# Patient Record
Sex: Male | Born: 2000 | Race: Black or African American | Hispanic: No | Marital: Single
Health system: Southern US, Community
[De-identification: ages and names within clinical notes are randomized; demographics above are authoritative.]

---
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
## 2022-02-11 ENCOUNTER — Other Ambulatory Visit: Payer: Self-pay

## 2022-02-11 ENCOUNTER — Emergency Department (HOSPITAL_COMMUNITY)
Admission: EM | Admit: 2022-02-11 | Discharge: 2022-02-11 | Disposition: A | Discharge status: Home / Self Care | Payer: Medicaid Other | Attending: Emergency Medicine | Admitting: Emergency Medicine

## 2022-02-11 ENCOUNTER — Encounter (HOSPITAL_COMMUNITY): Payer: Self-pay

## 2022-02-11 DIAGNOSIS — T63441A Toxic effect of venom of bees, accidental (unintentional), initial encounter: Secondary | ICD-10-CM | POA: Insufficient documentation

## 2022-02-11 DIAGNOSIS — R030 Elevated blood-pressure reading, without diagnosis of hypertension: Secondary | ICD-10-CM | POA: Diagnosis not present

## 2022-02-11 MED ORDER — CEPHALEXIN 500 MG PO CAPS: 500.0000 mg | ORAL_CAPSULE | Freq: Three times a day (TID) | ORAL | 0 refills | Status: AC

## 2022-02-11 MED ORDER — NAPROXEN 500 MG PO TABS: 500.0000 mg | ORAL_TABLET | Freq: Two times a day (BID) | ORAL | 0 refills | Status: AC | PRN

## 2022-02-11 NOTE — ED Triage Notes (Signed)
Pt was stung by bee 2 days ago on LUE and area is now swollen and red. No SOB or trouble breathing. Pt has not taken OTC meds

## 2022-02-11 NOTE — Discharge Instructions (Signed)
You were seen in the emergency department today for bee stings.  We recommend applying cool compresses.  Please take naproxen as needed for pain.  Naproxen is an NSAID, this is a nonsteroidal anti-inflammatory medicine to help with pain and swelling, take this every 12 hours as needed for pain/swelling, be sure to take this with food as it can cause stomach upset and or stomach bleeding.  Do not take other NSAID such as Motrin, ibuprofen, Advil, Aleve, Goody powder, Mobic, Aloxi cam etc. as these are similar medicines.   We also sent you with Keflex which is an antibiotic to cover for infection.  We have prescribed you new medication(s) today. Discuss the medications prescribed today with your pharmacist as they can have adverse effects and interactions with your other medicines including over the counter and prescribed medications. Seek medical evaluation if you start to experience new or abnormal symptoms after taking one of these medicines, seek care immediately if you start to experience difficulty breathing, feeling of your throat closing, facial swelling, or rash as these could be indications of a more serious allergic reaction  Follow-up with primary care in 1 week for reevaluation of the area.  Return to the ER for new or worsening symptoms including but not limited to spreading redness, increased pain, fever, numbness/weakness in your hand, trouble moving the arm, or any other concerns.

## 2022-02-11 NOTE — ED Provider Notes (Signed)
[facility] EMERGENCY DEPARTMENT Provider Note   CSN: [id number] Arrival date & time: 02/11/22  3716     History  Chief Complaint  Patient presents with   Insect Bite    Aaron Michael is a 21 y.o. male without significant past medical history who presents to the emergency department with complaints of bee stings to his left upper extremity that occurred 2 days prior.  Patient states he was stung in 2 places, since that he has had some worsening redness and swelling to the areas especially over the past 24 hours.  They are not particularly painful.  No alleviating or aggravating factors.  No intervention prior to arrival.  His friend became concerned prompting ED visit.  He denies fever, nausea, vomiting, shortness of breath, chest pain, or other areas of rashes/inflammation.  HPI     Home Medications Prior to Admission medications   Not on File      Allergies    Patient has no allergy information on record.    Review of Systems   Review of Systems  Constitutional:  Negative for chills and fever.  Respiratory:  Negative for cough and shortness of breath.   Cardiovascular:  Negative for chest pain.  Gastrointestinal:  Negative for abdominal pain, nausea and vomiting.  Skin:  Positive for color change and wound.  Neurological:  Negative for syncope.  All other systems reviewed and are negative.   Physical Exam Updated Vital Signs BP (!) 143/74 (BP Location: Right Arm)   Pulse 78   Temp 98.1 F (36.7 C) (Oral)   Resp 17   SpO2 100%  Physical Exam Vitals and nursing note reviewed.  Constitutional:      General: He is not in acute distress.    Appearance: Normal appearance. He is well-developed. He is not ill-appearing or toxic-appearing.  HENT:     Head: Normocephalic and atraumatic.     Mouth/Throat:     Comments: No angioedema.  Tolerating own secretions without difficulty. Eyes:     General:        Right eye: No discharge.        Left  eye: No discharge.     Conjunctiva/sclera: Conjunctivae normal.  Neck:     Comments: No midline tenderness.  Cardiovascular:     Rate and Rhythm: Normal rate and regular rhythm.     Pulses:          Radial pulses are 2+ on the right side and 2+ on the left side.  Pulmonary:     Effort: No respiratory distress.     Breath sounds: Normal breath sounds. No wheezing or rales.  Abdominal:     General: There is no distension.     Palpations: Abdomen is soft.     Tenderness: There is no abdominal tenderness.  Musculoskeletal:     Cervical back: Normal range of motion and neck supple.     Comments: Upper extremities: No obvious deformity, appreciable swelling, edema, erythema, ecchymosis, warmth, or open wounds. Patient has intact AROM throughout.  No tenderness to palpation.  Compartments are soft.  Skin:    General: Skin is warm and dry.     Capillary Refill: Capillary refill takes less than 2 seconds.          Comments: 7-8 cm diameter areas of erythema with mild swelling and tenderness to palpation.  Mildly indurated.  No fluctuance.  Not significantly tender to palpation.  Neurological:     Mental Status: He  is alert.     Comments: Alert. Clear speech. Sensation grossly intact to bilateral upper extremities. 5/5 symmetric grip strength. Ambulatory.   Psychiatric:        Mood and Affect: Mood normal.        Behavior: Behavior normal.     ED Results / Procedures / Treatments   Labs (all labs ordered are listed, but only abnormal results are displayed) Labs Reviewed - No data to display  EKG None  Radiology No results found.  Procedures Procedures    Medications Ordered in ED Medications - No data to display  ED Course/ Medical Decision Making/ A&P                           Medical Decision Making  Patient presents to the emergency department with complaints of redness and swelling to bee stings sustained 2 days prior.  He is nontoxic, resting comfortably, vitals  with some elevated blood pressure, doubt hypertensive emergency.  Chart/nursing note reviewed.  No angioedema, airway is patent, clinically does not seem consistent with anaphylaxis.  Does have 2 areas of erythema with swelling and induration present to the left upper extremity.  Placed bedside ultrasound, no foreign body or abscess identified.  Neurovascular intact distally.  Compartments are soft.  Suspect large localized reaction, however given progressive worsening will cover for possible cellulitis.  NSAIDs and Keflex prescribed.  Discussed cool compresses.  PCP follow-up.  I discussed  treatment plan, need for follow-up, and return precautions with the patient. Provided opportunity for questions, patient confirmed understanding and is in agreement with plan.   Final Clinical Impression(s) / ED Diagnoses Final diagnoses:  Bee sting, accidental or unintentional, initial encounter    Rx / DC Orders ED Discharge Orders          Ordered    cephALEXin (KEFLEX) 500 MG capsule  3 times daily        02/11/22 0430    naproxen (NAPROSYN) 500 MG tablet  2 times daily PRN        02/11/22 0430              Juanitta Earnhardt, Pleas Koch, PA-C 02/11/22 0431    Tilden Fossa, MD 02/11/22 [phone]

## 2024-07-04 ENCOUNTER — Other Ambulatory Visit: Payer: Self-pay

## 2024-07-04 ENCOUNTER — Emergency Department (HOSPITAL_COMMUNITY)
Admission: EM | Admit: 2024-07-04 | Discharge: 2024-07-04 | Disposition: A | Discharge status: Home / Self Care | Attending: Emergency Medicine | Admitting: Emergency Medicine

## 2024-07-04 DIAGNOSIS — Z79899 Other long term (current) drug therapy: Secondary | ICD-10-CM | POA: Insufficient documentation

## 2024-07-04 DIAGNOSIS — R112 Nausea with vomiting, unspecified: Secondary | ICD-10-CM | POA: Diagnosis present

## 2024-07-04 DIAGNOSIS — K529 Noninfective gastroenteritis and colitis, unspecified: Secondary | ICD-10-CM | POA: Diagnosis not present

## 2024-07-04 LAB — BASIC METABOLIC PANEL WITH GFR
Anion gap: 13 (ref 5–15)
BUN: 14 mg/dL (ref 6–20)
CO2: 21 mmol/L — ABNORMAL LOW (ref 22–32)
Calcium: 9.6 mg/dL (ref 8.9–10.3)
Chloride: 106 mmol/L (ref 98–111)
Creatinine, Ser: 1.08 mg/dL (ref 0.61–1.24)
GFR, Estimated: >60 mL/min
Glucose, Bld: 69 mg/dL — ABNORMAL LOW (ref 70–99)
Potassium: 4.6 mmol/L (ref 3.5–5.1)
Sodium: 140 mmol/L (ref 135–145)

## 2024-07-04 LAB — CBC WITH DIFFERENTIAL/PLATELET
Abs Immature Granulocytes: 0.01 K/uL (ref 0.00–0.07)
Basophils Absolute: 0 K/uL (ref 0.0–0.1)
Basophils Relative: 0 %
Eosinophils Absolute: 0.1 K/uL (ref 0.0–0.5)
Eosinophils Relative: 1 %
HCT: 45.2 % (ref 39.0–52.0)
Hemoglobin: 15.1 g/dL (ref 13.0–17.0)
Immature Granulocytes: 0 %
Lymphocytes Relative: 41 %
Lymphs Abs: 3.5 K/uL (ref 0.7–4.0)
MCH: 30.3 pg (ref 26.0–34.0)
MCHC: 33.4 g/dL (ref 30.0–36.0)
MCV: 90.6 fL (ref 80.0–100.0)
Monocytes Absolute: 0.7 K/uL (ref 0.1–1.0)
Monocytes Relative: 8 %
Neutro Abs: 4.2 K/uL (ref 1.7–7.7)
Neutrophils Relative %: 50 %
Platelets: 169 K/uL (ref 150–400)
RBC: 4.99 MIL/uL (ref 4.22–5.81)
RDW: 13.3 % (ref 11.5–15.5)
WBC: 8.5 K/uL (ref 4.0–10.5)
nRBC: 0 % (ref 0.0–0.2)

## 2024-07-04 LAB — CBG MONITORING, ED: Glucose-Capillary: 84 mg/dL (ref 70–99)

## 2024-07-04 MED ORDER — ONDANSETRON 4 MG PO TBDP
4.0000 mg | ORAL_TABLET | Freq: Once | ORAL | Status: AC
  Administered 2024-07-04: 4 mg via ORAL
  Filled 2024-07-04: qty 1

## 2024-07-04 MED ORDER — ACETAMINOPHEN 325 MG PO TABS
650.0000 mg | ORAL_TABLET | Freq: Once | ORAL | Status: AC
  Administered 2024-07-04: 650 mg via ORAL
  Filled 2024-07-04: qty 2

## 2024-07-04 MED ORDER — ONDANSETRON HCL 4 MG PO TABS: 4.0000 mg | ORAL_TABLET | Freq: Four times a day (QID) | ORAL | 0 refills | Status: AC

## 2024-07-04 NOTE — ED Provider Notes (Signed)
 " [place] EMERGENCY DEPARTMENT AT [facility] Provider Note   CSN: [id number] Arrival date & time: 07/04/24  9478     Patient presents with: Emesis   Aaron Michael is a 23 y.o. male.   Patient is a 23 year old male with no significant past medical history presenting to the emergency department with headache, nausea and diarrhea.  Patient states that he was at work last night when he started to develop a headache and nausea and had 1-2 episodes of small amount of diarrhea.  He denies any associated abdominal pain, fever, cough, congestion or sore throat.  Denies any known sick contacts.  The history is provided by the patient.  Emesis      Prior to Admission medications  Medication Sig Start Date End Date Taking? Authorizing Provider  ondansetron (ZOFRAN) 4 MG tablet Take 1 tablet (4 mg total) by mouth every 6 (six) hours. 07/04/24  Yes Ellouise, Tziporah Knoke K, DO  cephALEXin  (KEFLEX ) 500 MG capsule Take 1 capsule (500 mg total) by mouth 3 (three) times daily. 02/11/22   Petrucelli, Samantha R, PA-C  naproxen  (NAPROSYN ) 500 MG tablet Take 1 tablet (500 mg total) by mouth 2 (two) times daily as needed for moderate pain. 02/11/22   Petrucelli, Samantha R, PA-C    Allergies: Patient has no known allergies.    Review of Systems  Gastrointestinal:  Positive for vomiting.    Updated Vital Signs BP 126/68   Pulse 63   Temp 98.2 F (36.8 C)   Resp 16   SpO2 100%   Physical Exam Vitals and nursing note reviewed.  Constitutional:      General: He is not in acute distress.    Appearance: Normal appearance.  HENT:     Head: Normocephalic and atraumatic.     Nose: Nose normal.     Mouth/Throat:     Mouth: Mucous membranes are moist.     Pharynx: Oropharynx is clear.  Eyes:     Extraocular Movements: Extraocular movements intact.     Conjunctiva/sclera: Conjunctivae normal.  Cardiovascular:     Rate and Rhythm: Normal rate and regular rhythm.     Heart sounds:  Normal heart sounds.  Pulmonary:     Effort: Pulmonary effort is normal.     Breath sounds: Normal breath sounds.  Abdominal:     General: Abdomen is flat.     Palpations: Abdomen is soft.     Tenderness: There is no abdominal tenderness.  Musculoskeletal:        General: Normal range of motion.     Cervical back: Normal range of motion.  Skin:    General: Skin is warm and dry.  Neurological:     General: No focal deficit present.     Mental Status: He is alert and oriented to person, place, and time.  Psychiatric:        Mood and Affect: Mood normal.        Behavior: Behavior normal.     (all labs ordered are listed, but only abnormal results are displayed) Labs Reviewed  BASIC METABOLIC PANEL WITH GFR - Abnormal; Notable for the following components:      Result Value   CO2 21 (*)    Glucose, Bld 69 (*)    All other components within normal limits  CBC WITH DIFFERENTIAL/PLATELET  CBG MONITORING, ED    EKG: None  Radiology: No results found.   Procedures   Medications Ordered in the ED  acetaminophen (TYLENOL)  tablet 650 mg (650 mg Oral Given 07/04/24 0531)  ondansetron (ZOFRAN-ODT) disintegrating tablet 4 mg (4 mg Oral Given 07/04/24 0531)                                    Medical Decision Making This patient presents to the ED with chief complaint(s) of headache, nausea, diarrhea with no pertinent past medical history which further complicates the presenting complaint. The complaint involves an extensive differential diagnosis and also carries with it a high risk of complications and morbidity.    The differential diagnosis includes gastroenteritis, gastritis, GERD, pancreatitis, hepatitis, dehydration, electrolyte abnormality, viral syndrome, no abdominal pain making intra-abdominal infection less likely  Additional history obtained: Additional history obtained from N/A Records reviewed N/A  ED Course and Reassessment: On patient's arrival he is  hemodynamically stable in no acute distress.  Had labs initiated in triage.  He had mild hypoglycemia that was normal on repeat.  He was given Tylenol and Zofran in triage and states that his symptoms have resolved.  Spoke to the patient about considering adding on LFTs and lipase but as he is asymptomatic at this time and never had abdominal pain suspect this is likely viral syndrome and have low suspicion for pancreas or hepatic abnormalities.  Patient is comfortable with discharge home and was recommended outpatient follow-up.  He was given strict return precautions.  Independent labs interpretation:  The following labs were independently interpreted: Mild hypoglycemia, improved on repeat  Independent visualization of imaging: - N/A  Consultation: - Consulted or discussed management/test interpretation w/ external professional: N/a  Consideration for admission or further workup: Patient has no emergent conditions requiring admission or further work-up at this time and is stable for discharge home with primary care follow-up  Social Determinants of health: N/A    Amount and/or Complexity of Data Reviewed Labs: ordered.  Risk OTC drugs. Prescription drug management.       Final diagnoses:  Gastroenteritis    ED Discharge Orders          Ordered    ondansetron (ZOFRAN) 4 MG tablet  Every 6 hours        07/04/24 1149               Kingsley, Shery [place] K, DO 07/04/24 1150  "

## 2024-07-04 NOTE — Discharge Instructions (Signed)
 You were seen in the emergency department for your headache, nausea and diarrhea.  Your workup showed no signs of severe dehydration and I suspect you likely have a viral infection.  You can take Zofran as needed for nausea and Tylenol as needed for headaches.  You can follow-up with your primary doctor to have your symptoms rechecked.  You should return to the emergency department if you have repetitive vomiting despite the nausea medicine, severe abdominal pain or any other new or concerning symptoms.

## 2024-07-04 NOTE — ED Triage Notes (Signed)
 Patient reports headache with emesis and diarrhea this morning .
